# Patient Record
Sex: Male | Born: 1995 | Race: White | Hispanic: No | Marital: Single | Smoking: Never smoker
Health system: Southern US, Community
[De-identification: ages and names within clinical notes are randomized; demographics above are authoritative.]

## PROBLEM LIST (undated history)

## (undated) DIAGNOSIS — S62109A Fracture of unspecified carpal bone, unspecified wrist, initial encounter for closed fracture: Secondary | ICD-10-CM

## (undated) HISTORY — PX: WISDOM TOOTH EXTRACTION: SHX21

---
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
## 2016-02-02 ENCOUNTER — Emergency Department (HOSPITAL_BASED_OUTPATIENT_CLINIC_OR_DEPARTMENT_OTHER)
Admission: EM | Admit: 2016-02-02 | Discharge: 2016-02-03 | Disposition: A | Discharge status: Home / Self Care | Payer: BLUE CROSS/BLUE SHIELD | Attending: Emergency Medicine | Admitting: Emergency Medicine

## 2016-02-02 ENCOUNTER — Encounter (HOSPITAL_BASED_OUTPATIENT_CLINIC_OR_DEPARTMENT_OTHER): Payer: Self-pay | Admitting: *Deleted

## 2016-02-02 DIAGNOSIS — R21 Rash and other nonspecific skin eruption: Secondary | ICD-10-CM | POA: Diagnosis present

## 2016-02-02 DIAGNOSIS — L237 Allergic contact dermatitis due to plants, except food: Secondary | ICD-10-CM | POA: Diagnosis not present

## 2016-02-02 HISTORY — DX: Fracture of unspecified carpal bone, unspecified wrist, initial encounter for closed fracture: S62.109A

## 2016-02-02 MED ORDER — PREDNISONE 20 MG PO TABS: ORAL_TABLET | ORAL | 0 refills | Status: AC

## 2016-02-02 MED ORDER — CLOBETASOL PROP EMOLLIENT BASE 0.05 % EX CREA: TOPICAL_CREAM | CUTANEOUS | 0 refills | Status: AC

## 2016-02-02 MED ORDER — DOXYCYCLINE HYCLATE 100 MG PO CAPS: ORAL_CAPSULE | ORAL | 0 refills | Status: AC

## 2016-02-02 MED ORDER — METHYLPREDNISOLONE SODIUM SUCC 125 MG IJ SOLR
125.0000 mg | Freq: Once | INTRAMUSCULAR | Status: AC
  Administered 2016-02-03: 125 mg via INTRAVENOUS
  Filled 2016-02-02: qty 2

## 2016-02-02 NOTE — ED Triage Notes (Signed)
Patient is alert and oriented x4.  He is complaining of a rash that covers most of his body.  Bilateral legs, torso, chest and arms.  Currently he denies any pain.

## 2016-02-02 NOTE — ED Notes (Signed)
Pt states he was working outside and the next day noticed what looked like "mosquito bites" on his arms. By Sunday, he had blistered and weeping areas to his arms, legs and trunk. Took Benadryl and Claritin with no relief. Also using Ivyrest.

## 2016-02-02 NOTE — ED Provider Notes (Signed)
By signing my name below, I, Jeff Barnett, attest that this documentation has been prepared under the direction and in the presence of Jeff LibraJohn Lielle Vandervort, MD. Electronically Signed: Phillis HaggisGabriella Barnett, ED Scribe. 02/02/16. 11:47 PM.  Jeff-EMERGENCY DEPT Jeff Barnett: Jeff DellJ. [name], MD, FACEP  CSN: [id number] MRN: [id number] ARRIVAL: 02/02/16 at 2122   CHIEF COMPLAINT  Rash  HISTORY OF PRESENT ILLNESS  HPI Comments: Jeff Barnett is a 20 y.o. male who presents to the Emergency Department complaining of a gradually worsening rash to the bilateral legs, abdomen, and arms onset 3 days ago. He reports associated drainage from the rash. Pt says that he was working outside three days ago when he believes he got in contact with poison oak. The rash initially looked like a bunch of mosquito bites but has become more confluent, vesicular and weeping. Pt has used Benadryl, Claritin, and Ivyrest without adequate relief. He denies pain or itching, nausea, vomiting, diarrhea, trouble swallowing, oropharyngeal swelling, or SOB.   Past Medical History:  Diagnosis Date  . Broken wrist    right     Past Surgical History:  Procedure Laterality Date  . WISDOM TOOTH EXTRACTION      History reviewed. No pertinent family history.  Social History  Substance Use Topics  . Smoking status: Never Smoker  . Smokeless tobacco: Never Used  . Alcohol use Not on file    Prior to Admission medications   Medication Sig Start Date End Date Taking? Authorizing Provider  Clobetasol Prop Emollient Base (CLOBETASOL PROPIONATE E) 0.05 % emollient cream Applysparingly to affected skin twice daily. 02/02/16   Jeff Aldaz, MD  doxycycline (VIBRAMYCIN) 100 MG capsule Take one capsule twice daily if signs of skin infection occur. 02/02/16   Jeff Muench, MD  predniSONE (DELTASONE) 20 MG tablet 3 tabs po daily x 3 days, then 2 tabs x 3 days, then 1.5 tabs x 3 days, then 1 tab x 3 days, then 0.5 tabs x 3 days 02/02/16    Jeff LibraJohn Abbegayle Denault, MD    Allergies Review of patient's allergies indicates no known allergies.   REVIEW OF SYSTEMS  Negative except as noted here or in the History of Present Illness.   PHYSICAL EXAMINATION  Initial Vital Signs Blood pressure 127/78, pulse 88, temperature 98.2 F (36.8 C), temperature source Oral, resp. rate 18, height 5\' 6"  (1.676 m), weight 150 lb (68 kg), SpO2 100 %.  Examination General: Well-developed, well-nourished male in no acute distress; appearance consistent with age of record HENT: normocephalic; atraumatic Eyes: pupils equal, round and reactive to light; extraocular muscles intact Neck: supple Heart: regular rate and rhythm Lungs: clear to auscultation bilaterally Abdomen: soft; nondistended Extremities: No deformity; full range of motion; pulses normal Neurologic: Awake, alert and oriented; motor function intact in all extremities and symmetric; no facial droop Skin: Warm and dry; erythematous, crusted, weeping rash of the forearms, lower legs, and abdomen.  Psychiatric: Normal mood and affect   RESULTS  Summary of this visit's results, reviewed by myself:   EKG Interpretation  Date/Time:    Ventricular Rate:    PR Interval:    QRS Duration:   QT Interval:    QTC Calculation:   R Axis:     Text Interpretation:        Laboratory Studies: No results found for this or any previous visit (from the past 24 hour(s)). Imaging Studies: No results found.  ED COURSE  Nursing notes and initial vitals signs, including pulse oximetry, reviewed.  Vitals:   02/02/16 2132  BP: 127/78  Pulse: 88  Resp: 18  Temp: 98.2 F (36.8 C)  TempSrc: Oral  SpO2: 100%  Weight: 150 lb (68 kg)  Height: 5\' 6"  (1.676 m)    PROCEDURES    ED DIAGNOSES     ICD-9-CM ICD-10-CM   1. Contact dermatitis due to poison oak 692.6 L23.7     I personally performed the services described in this documentation, which was scribed in my presence. The recorded  information has been reviewed and is accurate.    Jeff Libra, MD 02/03/16 [phone]

## 2016-02-03 NOTE — ED Notes (Signed)
Pt and father given d/c instructions as per chart. Verbalizes understanding. No questions. Rx x 3

## 2019-11-28 DIAGNOSIS — L237 Allergic contact dermatitis due to plants, except food: Secondary | ICD-10-CM | POA: Diagnosis not present

## 2020-04-10 DIAGNOSIS — Z03818 Encounter for observation for suspected exposure to other biological agents ruled out: Secondary | ICD-10-CM | POA: Diagnosis not present

## 2020-10-08 ENCOUNTER — Ambulatory Visit
Admission: RE | Admit: 2020-10-08 | Discharge: 2020-10-08 | Disposition: A | Discharge status: Home / Self Care | Payer: No Typology Code available for payment source | Source: Ambulatory Visit | Attending: Nurse Practitioner | Admitting: Nurse Practitioner

## 2020-10-08 ENCOUNTER — Other Ambulatory Visit: Payer: Self-pay | Admitting: Nurse Practitioner

## 2020-10-08 ENCOUNTER — Other Ambulatory Visit: Payer: Self-pay

## 2020-10-08 DIAGNOSIS — Z021 Encounter for pre-employment examination: Secondary | ICD-10-CM

## 2023-01-25 IMAGING — CR DG CHEST 1V
2 series · 2 of 2 positions shown · non-contrast
Comparison: None.

CLINICAL DATA: Pre employment.  Asymptomatic.

EXAM:
CHEST  1 VIEW

[w chest pa]
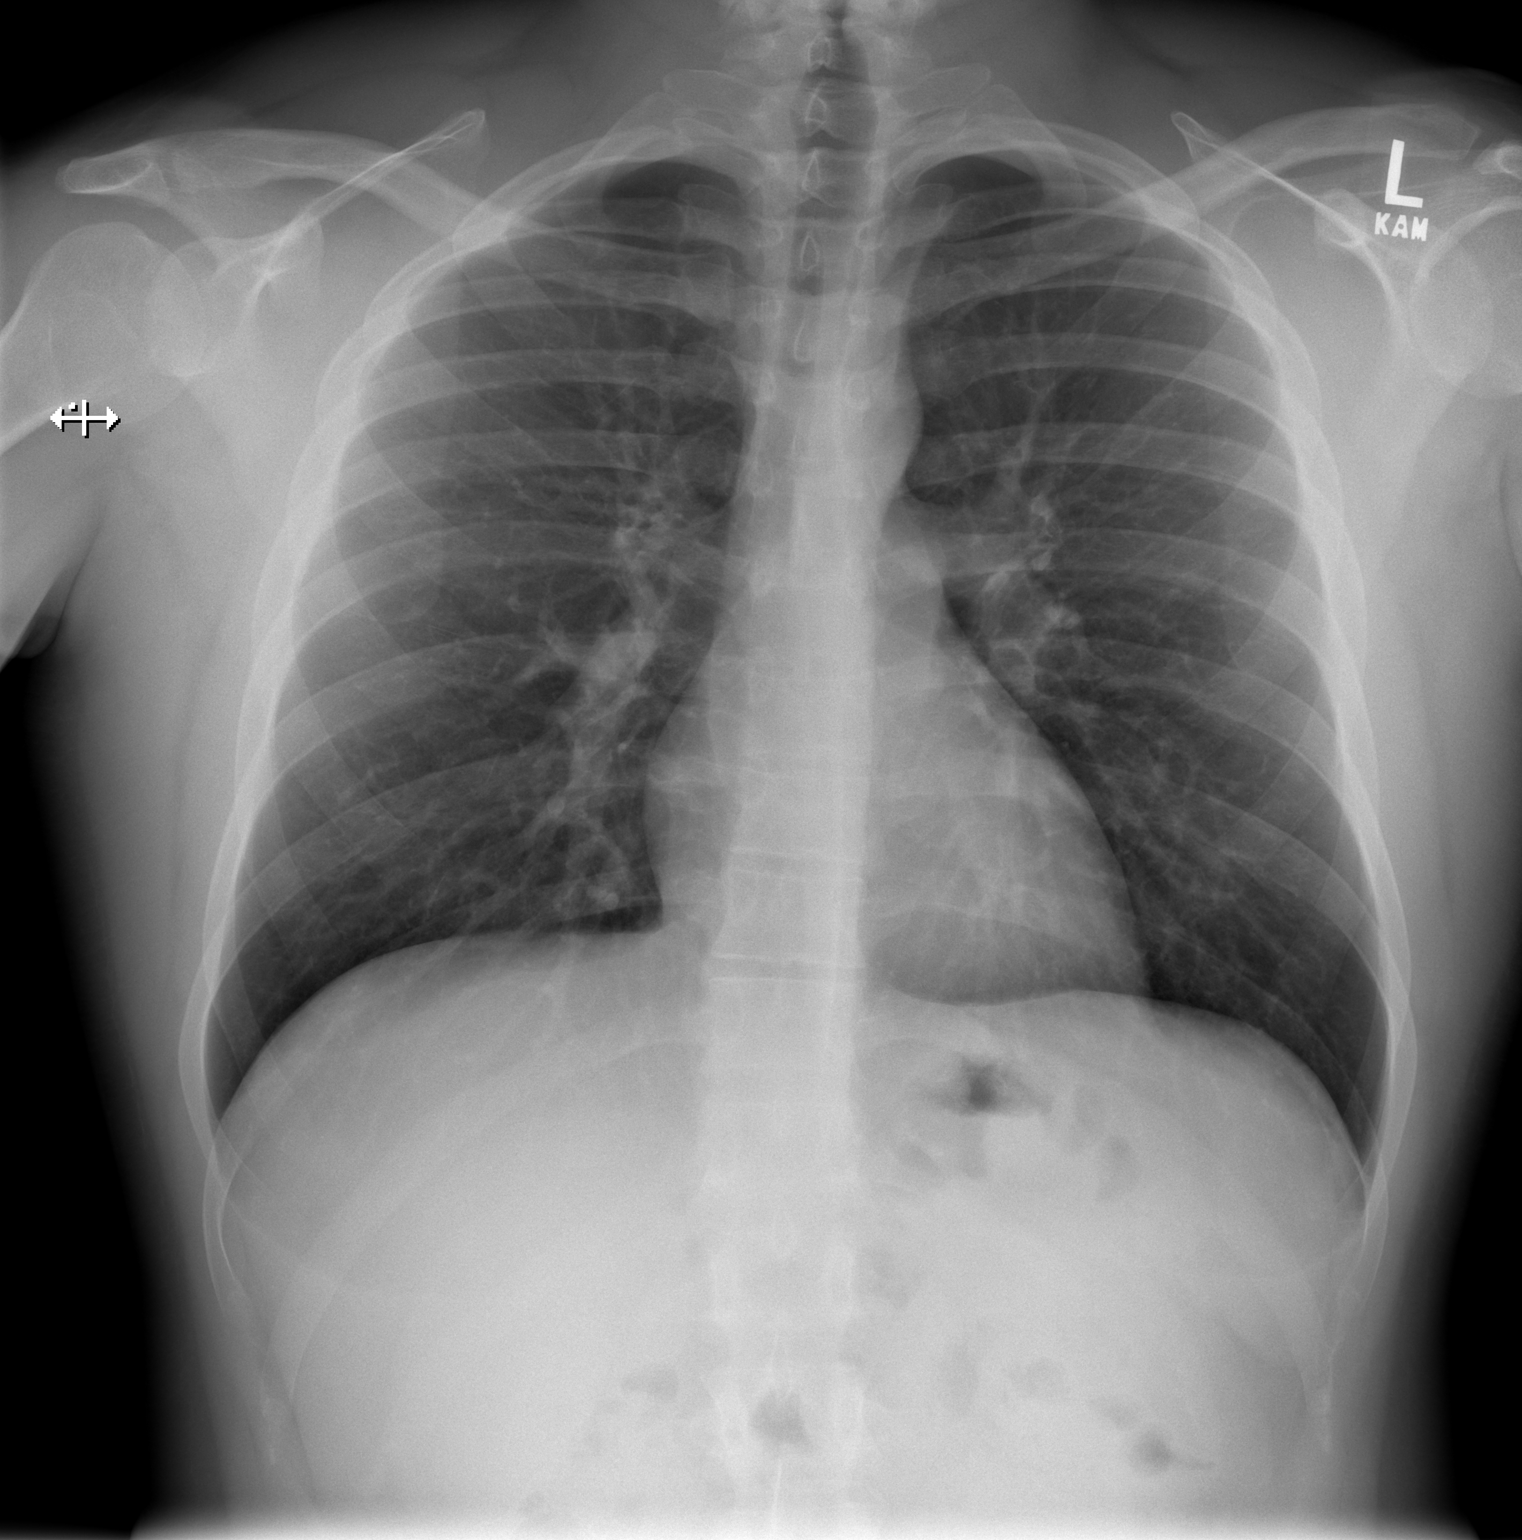

[w chest lat]
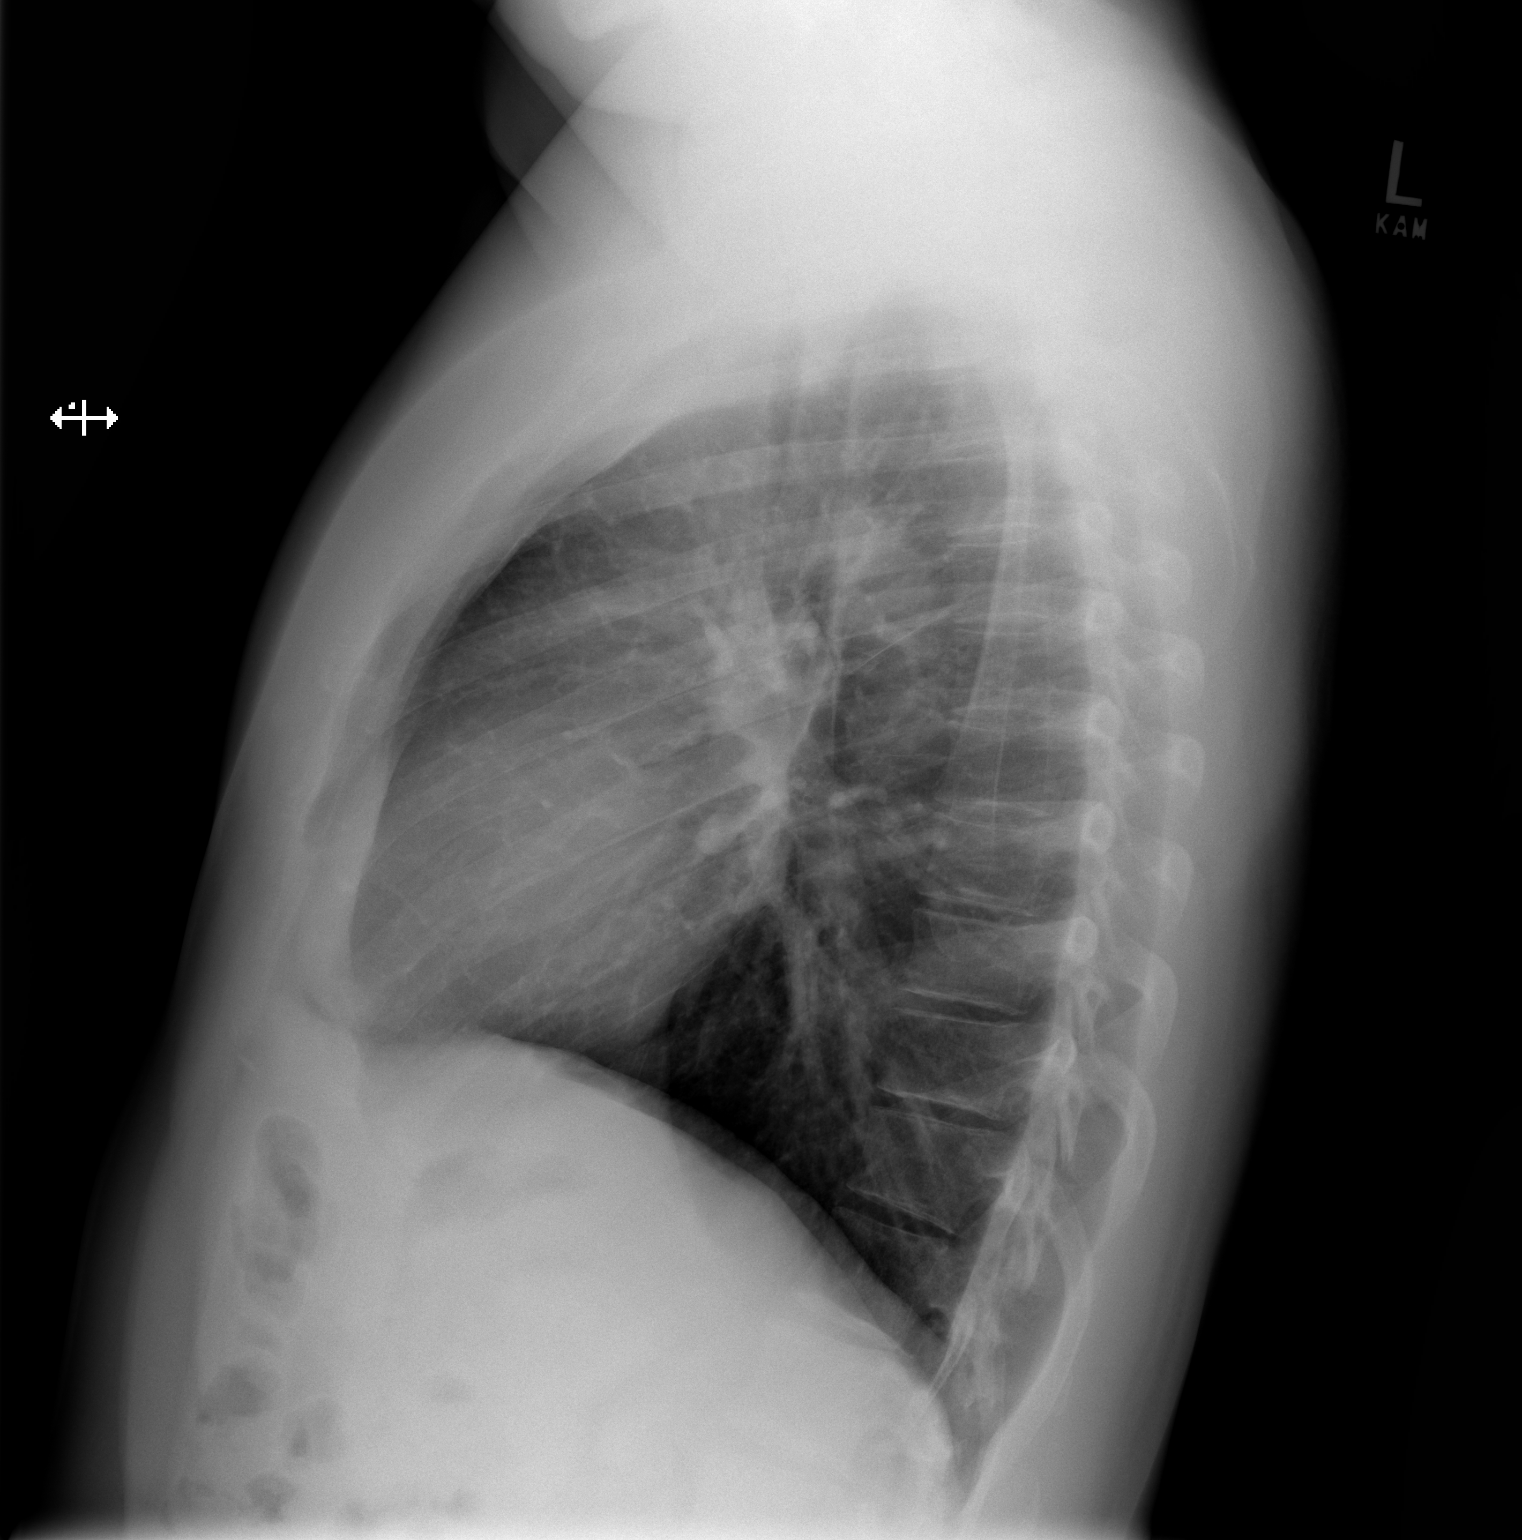

[2 of 2 positions shown; findings below may reference images not displayed]

FINDINGS: Midline trachea.  Normal heart size and mediastinal contours.

Sharp costophrenic angles.  No pneumothorax.  Clear lungs.
IMPRESSION: Normal chest.
